# Patient Record
Sex: Female | Born: 1995 | Race: Black or African American | Hispanic: No | Marital: Single | Smoking: Never smoker
Health system: Southern US, Community
[De-identification: ages and names within clinical notes are randomized; demographics above are authoritative.]

## PROBLEM LIST (undated history)

## (undated) DIAGNOSIS — J45909 Unspecified asthma, uncomplicated: Secondary | ICD-10-CM

## (undated) DIAGNOSIS — E282 Polycystic ovarian syndrome: Secondary | ICD-10-CM

## (undated) HISTORY — DX: Polycystic ovarian syndrome: E28.2

---
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
## 2015-06-21 ENCOUNTER — Emergency Department (HOSPITAL_COMMUNITY): Payer: Managed Care, Other (non HMO)

## 2015-06-21 ENCOUNTER — Encounter (HOSPITAL_COMMUNITY): Payer: Self-pay | Admitting: Family Medicine

## 2015-06-21 ENCOUNTER — Emergency Department (HOSPITAL_COMMUNITY)
Admission: EM | Admit: 2015-06-21 | Discharge: 2015-06-21 | Disposition: A | Discharge status: Home / Self Care | Payer: Managed Care, Other (non HMO) | Attending: Emergency Medicine | Admitting: Emergency Medicine

## 2015-06-21 DIAGNOSIS — R197 Diarrhea, unspecified: Secondary | ICD-10-CM | POA: Insufficient documentation

## 2015-06-21 DIAGNOSIS — Z792 Long term (current) use of antibiotics: Secondary | ICD-10-CM | POA: Insufficient documentation

## 2015-06-21 DIAGNOSIS — Z3202 Encounter for pregnancy test, result negative: Secondary | ICD-10-CM | POA: Diagnosis not present

## 2015-06-21 DIAGNOSIS — Z7952 Long term (current) use of systemic steroids: Secondary | ICD-10-CM | POA: Diagnosis not present

## 2015-06-21 DIAGNOSIS — Z79899 Other long term (current) drug therapy: Secondary | ICD-10-CM | POA: Insufficient documentation

## 2015-06-21 DIAGNOSIS — R1030 Lower abdominal pain, unspecified: Secondary | ICD-10-CM

## 2015-06-21 DIAGNOSIS — K5900 Constipation, unspecified: Secondary | ICD-10-CM | POA: Insufficient documentation

## 2015-06-21 DIAGNOSIS — R1031 Right lower quadrant pain: Secondary | ICD-10-CM | POA: Insufficient documentation

## 2015-06-21 DIAGNOSIS — J45909 Unspecified asthma, uncomplicated: Secondary | ICD-10-CM | POA: Diagnosis not present

## 2015-06-21 DIAGNOSIS — R109 Unspecified abdominal pain: Secondary | ICD-10-CM | POA: Diagnosis present

## 2015-06-21 DIAGNOSIS — R102 Pelvic and perineal pain: Secondary | ICD-10-CM

## 2015-06-21 LAB — COMPREHENSIVE METABOLIC PANEL
ALBUMIN: 3.7 g/dL (ref 3.5–5.0)
ALT: 18 U/L (ref 14–54)
ANION GAP: 8 (ref 5–15)
AST: 25 U/L (ref 15–41)
Alkaline Phosphatase: 67 U/L (ref 38–126)
BILIRUBIN TOTAL: 0.3 mg/dL (ref 0.3–1.2)
BUN: 7 mg/dL (ref 6–20)
CHLORIDE: 103 mmol/L (ref 101–111)
CO2: 26 mmol/L (ref 22–32)
Calcium: 9.1 mg/dL (ref 8.9–10.3)
Creatinine, Ser: 1.03 mg/dL — ABNORMAL HIGH (ref 0.44–1.00)
GFR calc Af Amer: >60 mL/min (ref >60)
GFR calc non Af Amer: >60 mL/min (ref >60)
GLUCOSE: 111 mg/dL — AB (ref 65–99)
Potassium: 3.8 mmol/L (ref 3.5–5.1)
Sodium: 137 mmol/L (ref 135–145)
Total Protein: 6.9 g/dL (ref 6.5–8.1)

## 2015-06-21 LAB — CBC
HEMATOCRIT: 37.1 % (ref 36.0–46.0)
HEMOGLOBIN: 11.9 g/dL — AB (ref 12.0–15.0)
MCH: 25.4 pg — ABNORMAL LOW (ref 26.0–34.0)
MCHC: 32.1 g/dL (ref 30.0–36.0)
MCV: 79.1 fL (ref 78.0–100.0)
Platelets: 349 10*3/uL (ref 150–400)
RBC: 4.69 MIL/uL (ref 3.87–5.11)
RDW: 15 % (ref 11.5–15.5)
WBC: 6.2 10*3/uL (ref 4.0–10.5)

## 2015-06-21 LAB — URINALYSIS, ROUTINE W REFLEX MICROSCOPIC
Bilirubin Urine: NEGATIVE
Glucose, UA: NEGATIVE mg/dL
Hgb urine dipstick: NEGATIVE
Ketones, ur: NEGATIVE mg/dL
Leukocytes, UA: NEGATIVE
Nitrite: NEGATIVE
PROTEIN: NEGATIVE mg/dL
SPECIFIC GRAVITY, URINE: 1.007 (ref 1.005–1.030)
UROBILINOGEN UA: 0.2 mg/dL (ref 0.0–1.0)
pH: 7 (ref 5.0–8.0)

## 2015-06-21 LAB — I-STAT BETA HCG BLOOD, ED (MC, WL, AP ONLY)

## 2015-06-21 LAB — LIPASE, BLOOD: Lipase: 32 U/L (ref 11–51)

## 2015-06-21 MED ORDER — DICYCLOMINE HCL 20 MG PO TABS: 20.0000 mg | ORAL_TABLET | Freq: Two times a day (BID) | ORAL | Status: AC

## 2015-06-21 MED ORDER — SODIUM CHLORIDE 0.9 % IV BOLUS (SEPSIS)
1000.0000 mL | Freq: Once | INTRAVENOUS | Status: AC
  Administered 2015-06-21: 1000 mL via INTRAVENOUS

## 2015-06-21 MED ORDER — KETOROLAC TROMETHAMINE 30 MG/ML IJ SOLN
15.0000 mg | Freq: Once | INTRAMUSCULAR | Status: AC
  Administered 2015-06-21: 15 mg via INTRAVENOUS
  Filled 2015-06-21: qty 1

## 2015-06-21 NOTE — Discharge Instructions (Signed)
As discussed, with your ongoing abdominal pain, diarrhea, there suspicion for colitis or irritation of your intestinal tract.  If you develop new, or concerning changes, such as those we discussed, it is very important that you return here for further evaluation and management.  Return here for any concerning changes in your condition.  Otherwise, please be sure to follow-up with your primary care physician within one week to ensure appropriate ongoing care.

## 2015-06-21 NOTE — ED Notes (Signed)
Patient transported to Ultrasound 

## 2015-06-21 NOTE — ED Notes (Signed)
Pt here for intermittent abd pain since Saturday. sts battling some constipation. sts she has been eating and drinking normally but some nausea.

## 2015-06-21 NOTE — ED Provider Notes (Signed)
CSN: [id number]     Arrival date & time 06/21/15  1326 History   First MD Initiated Contact with Patient 06/21/15 1505     Chief Complaint  Patient presents with  . Abdominal Pain  . Constipation     (Consider location/radiation/quality/duration/timing/severity/associated sxs/prior Treatment) HPI Patient presents with new suprapubic and right lower quadrant pain. Pain began about 5 days ago, since onset it has been intermittent, but with increasing frequency, length of symptoms. Pain is severe, crampy, sharp. There is associated anorexia, nausea, diarrhea, as well as tenesmus. No urinary changes, no vomiting, no fever, chills.  No vaginal complaints, last menstrual period was one month ago Patient denies medical palms beyond PCOS. She takes no medication regularly, does not smoke.  She denies a history of abdominal surgery   Past Medical History  Diagnosis Date  . Asthma    History reviewed. No pertinent past surgical history. History reviewed. No pertinent family history. Social History  Substance Use Topics  . Smoking status: Never Smoker   . Smokeless tobacco: Never Used  . Alcohol Use: Yes     Comment: socially   OB History    No data available     Review of Systems  Constitutional:       Per HPI, otherwise negative  HENT:       Per HPI, otherwise negative  Respiratory:       Per HPI, otherwise negative  Cardiovascular:       Per HPI, otherwise negative  Gastrointestinal: Positive for abdominal pain and diarrhea. Negative for vomiting.  Endocrine:       Negative aside from HPI  Genitourinary:       Neg aside from HPI   Musculoskeletal:       Per HPI, otherwise negative  Skin: Negative.   Neurological: Negative for syncope.      Allergies  Review of patient's allergies indicates no known allergies.  Home Medications   Prior to Admission medications   Medication Sig Start Date End Date Taking? Authorizing Provider  Multiple Vitamin (MULTIVITAMIN)  tablet Take 1 tablet by mouth daily.   Yes Historical Provider, MD  triamcinolone cream (KENALOG) 0.1 % Apply 1 application topically 2 (two) times daily. 05/02/15  Yes Historical Provider, MD  cephALEXin (KEFLEX) 500 MG capsule Take 1 capsule (500 mg total) by mouth 4 (four) times daily. Patient not taking: Reported on 06/21/2015 09/30/14   Hayden Rasmussenavid Mabe, NP  dicyclomine (BENTYL) 20 MG tablet Take 1 tablet (20 mg total) by mouth 2 (two) times daily. 06/21/15   Gerhard Munchobert Ziv Welchel, MD  fluconazole (DIFLUCAN) 150 MG tablet 1 tab po x 1. May repeat in 72 hours if no improvement Patient not taking: Reported on 06/21/2015 09/30/14   Hayden Rasmussenavid Mabe, NP   BP 121/60 mmHg  Pulse 80  Temp(Src) 98.8 F (37.1 C)  Resp 18  Wt 170 lb (77.111 kg)  SpO2 100% Physical Exam  Constitutional: She is oriented to person, place, and time. She appears well-developed and well-nourished. No distress.  HENT:  Head: Normocephalic and atraumatic.  Eyes: Conjunctivae and EOM are normal.  Cardiovascular: Normal rate and regular rhythm.   Pulmonary/Chest: Effort normal and breath sounds normal. No stridor. No respiratory distress.  Abdominal: She exhibits no distension. There is tenderness in the right lower quadrant and suprapubic area. There is guarding. There is no rigidity and no rebound.  Musculoskeletal: She exhibits no edema.  Neurological: She is alert and oriented to person, place, and time. No cranial nerve  deficit.  Skin: Skin is warm and dry.  Psychiatric: She has a normal mood and affect.  Nursing note and vitals reviewed.   ED Course  Procedures (including critical care time) Labs Review Labs Reviewed  COMPREHENSIVE METABOLIC PANEL - Abnormal; Notable for the following:    Glucose, Bld 111 (*)    Creatinine, Ser 1.03 (*)    All other components within normal limits  CBC - Abnormal; Notable for the following:    Hemoglobin 11.9 (*)    MCH 25.4 (*)    All other components within normal limits  LIPASE, BLOOD   URINALYSIS, ROUTINE W REFLEX MICROSCOPIC (NOT AT [facility])  I-STAT BETA HCG BLOOD, ED ([facility], [facility], [facility] ONLY)    Imaging Review US Transvaginal Non-ob  06/21/2015  CLINICAL DATA:  19 year old female with reported history of polycystic ovarian syndrome presents with mid to right-sided abdominopelvic pain for 5 days. Negative serum pregnancy test. LMP 05/27/2015, day 26 of menstrual cycle. EXAM: TRANSABDOMINAL AND TRANSVAGINAL ULTRASOUND OF PELVIS TECHNIQUE: Both transabdominal and transvaginal ultrasound examinations of the pelvis were performed. Transabdominal technique was performed for global imaging of the pelvis including uterus, ovaries, adnexal regions, and pelvic cul-de-sac. It was necessary to proceed with endovaginal exam following the transabdominal exam to visualize the endometrium and adnexa. COMPARISON:  None FINDINGS: Uterus Measurements: 6.2 x 3.1 x 3.7 cm. The anteverted anteflexed uterus is normal in size and configuration, with no uterine fibroids or other myometrial abnormality. Endometrium Thickness: 10 mm.  No focal abnormality visualized. Right ovary Measurements: 2.5 x 2.1 x 1.9 cm, for a right ovarian volume of 5 cc. Normal appearance/no adnexal mass. No evidence of increased number of immature peripheral follicles. Left ovary Measurements: 2.6 x 2.2 x 2.0 cm, for a left ovarian volume of 6 cc. Normal appearance/no adnexal mass. No evidence of increased number of immature peripheral follicles. Other findings No abnormal free fluid in the pelvis. IMPRESSION: Normal pelvic sonogram. Ovaries appear normal and do not meet sonographic criteria for polycystic ovarian syndrome. Electronically Signed   By: Delbert Phenix M.D.   On: 06/21/2015 16:45   US Pelvis Complete  06/21/2015  CLINICAL DATA:  19 year old female with reported history of polycystic ovarian syndrome presents with mid to right-sided abdominopelvic pain for 5 days. Negative serum pregnancy test. LMP 05/27/2015, day 26 of menstrual  cycle. EXAM: TRANSABDOMINAL AND TRANSVAGINAL ULTRASOUND OF PELVIS TECHNIQUE: Both transabdominal and transvaginal ultrasound examinations of the pelvis were performed. Transabdominal technique was performed for global imaging of the pelvis including uterus, ovaries, adnexal regions, and pelvic cul-de-sac. It was necessary to proceed with endovaginal exam following the transabdominal exam to visualize the endometrium and adnexa. COMPARISON:  None FINDINGS: Uterus Measurements: 6.2 x 3.1 x 3.7 cm. The anteverted anteflexed uterus is normal in size and configuration, with no uterine fibroids or other myometrial abnormality. Endometrium Thickness: 10 mm.  No focal abnormality visualized. Right ovary Measurements: 2.5 x 2.1 x 1.9 cm, for a right ovarian volume of 5 cc. Normal appearance/no adnexal mass. No evidence of increased number of immature peripheral follicles. Left ovary Measurements: 2.6 x 2.2 x 2.0 cm, for a left ovarian volume of 6 cc. Normal appearance/no adnexal mass. No evidence of increased number of immature peripheral follicles. Other findings No abnormal free fluid in the pelvis. IMPRESSION: Normal pelvic sonogram. Ovaries appear normal and do not meet sonographic criteria for polycystic ovarian syndrome. Electronically Signed   By: Delbert Phenix M.D.   On: 06/21/2015 16:45   I have  personally reviewed and evaluated these images and lab results as part of my medical decision-making.   On repeat exam after the patient's ultrasound result, the patient states that she feels better. She and I had a lengthy conversation about her abdominal pain, the remaining low suspicion for appendicitis, but the remaining possibility. We discussed risks and benefits of CT scan, given her lack of fever, leukocytosis, persistent pain, vomiting, and she elected for close outpatient follow-up, to minimize radiation exposure. This seems reasonable.   MDM   Final diagnoses:  Lower abdominal pain   Generally  well-appearing.  Presents with intermittent abdominal pain for several days, no vaginal, urinary complaints. Patient does have history of PCO S, and with her lower pain, ultrasound was performed. Does not demonstrate torsion, nor substantial pathology. Patient's labs, vitals, physical presentation reassuring, and there is low suspicion for appendicitis, though patient was discharged in stable condition with explicit return precautions, as well as medication for pain relief, primary care follow-up.  Gerhard Munch, MD 06/21/15 1755

## 2015-08-09 ENCOUNTER — Emergency Department: Payer: Commercial Managed Care - PPO

## 2015-08-09 ENCOUNTER — Emergency Department
Admission: EM | Admit: 2015-08-09 | Discharge: 2015-08-09 | Disposition: A | Discharge status: Home / Self Care | Payer: Commercial Managed Care - PPO | Attending: Emergency Medicine | Admitting: Emergency Medicine

## 2015-08-09 DIAGNOSIS — L03115 Cellulitis of right lower limb: Secondary | ICD-10-CM | POA: Insufficient documentation

## 2015-08-09 MED ORDER — CLINDAMYCIN HCL 300 MG PO CAPS
300.0000 mg | ORAL_CAPSULE | Freq: Four times a day (QID) | ORAL | Status: AC
Start: 2015-08-09 — End: 2015-08-16

## 2015-08-09 NOTE — ED Notes (Signed)
Rash on feet and legs since this am. Believes she was bitten by something on lt toe.

## 2015-08-09 NOTE — ED Provider Notes (Signed)
EMERGENCY DEPARTMENT HISTORY AND PHYSICAL EXAM     Physician/Midlevel provider first contact with patient: 08/09/15 1538         Date: 08/09/2015  Patient Name: Melanie Mcclain    History of Presenting Illness     Chief Complaint   Patient presents with   . Rash       History Provided By: Patient    Chief Complaint: rash to right leg   Onset: this morning  Timing: Suddenly  Location: right calf  Quality: Achy / itchy  Severity: Moderate  Modifying Factors: better with benadryl  Associated Symptoms: redness and warmth to area    Additional History: Melanie Mcclain is a 19 y.o. female presenting to the ED with red itchy raised swollen patch to right calf since this morning, states feels like she was bit by something, denies fevers or chills, states had something similar a few years ago and was treated with steroids. Too hydroxyzine this morning for itching     PCP: No primary care provider on file.      No current facility-administered medications for this encounter.     Current Outpatient Prescriptions   Medication Sig Dispense Refill   . clindamycin (CLEOCIN) 300 MG capsule Take 1 capsule (300 mg total) by mouth 4 (four) times daily. 28 capsule 0       Past History     Past Medical History:  Past Medical History   Diagnosis Date   . PCOS (polycystic ovarian syndrome)        Past Surgical History:  Past Surgical History   Procedure Laterality Date   . No past surgeries         Family History:  No family history on file.    Social History:  Social History   Substance Use Topics   . Smoking status: Never Smoker    . Smokeless tobacco: None   . Alcohol Use: No       Allergies:  No Known Allergies    Review of Systems     Review of Systems   Constitutional: Negative for fever, chills and malaise/fatigue.   HENT: Negative for congestion, ear pain and sore throat.    Eyes: Negative for blurred vision, double vision, photophobia and discharge.   Respiratory: Negative for cough, sputum production, shortness of breath and  wheezing.    Cardiovascular: Negative for chest pain, palpitations, claudication and leg swelling.   Gastrointestinal: Negative for nausea, vomiting, abdominal pain and diarrhea.   Genitourinary: Negative for dysuria, urgency, frequency, hematuria and flank pain.   Musculoskeletal: Negative for myalgias, back pain, joint pain and neck pain.   Skin: Positive for itching and rash.   Neurological: Negative for dizziness, tingling, tremors, focal weakness, seizures, loss of consciousness, weakness and headaches.       Physical Exam   BP 140/84 mmHg  Pulse 87  Temp(Src) 97.8 F (36.6 C) (Oral)  Resp 14  Ht 4\' 11"  (1.499 m)  Wt 68.04 kg  BMI 30.28 kg/m2  SpO2 99%  LMP 05/26/2015    Physical Exam   Constitutional: She is oriented to person, place, and time and well-developed, well-nourished, and in no distress. No distress.   Eyes: Conjunctivae are normal. Pupils are equal, round, and reactive to light. Right eye exhibits no discharge. Left eye exhibits no discharge.   Cardiovascular: Normal rate, regular rhythm, normal heart sounds and intact distal pulses.    Pulmonary/Chest: Effort normal and breath sounds normal. No respiratory distress. She  exhibits no tenderness.   Musculoskeletal:        Right knee: Normal.        Legs:       Feet:    Neurological: She is alert and oriented to person, place, and time. No cranial nerve deficit. Coordination normal. GCS score is 15.   Skin: Skin is warm and dry. She is not diaphoretic. There is erythema.   Psychiatric: Mood, memory, affect and judgment normal.       Diagnostic Study Results     Labs -     Results     ** No results found for the last 24 hours. **          Radiologic Studies -   Radiology Results (24 Hour)     ** No results found for the last 24 hours. **      .    Medical Decision Making   I am the first provider for this patient.    I reviewed the vital signs, available nursing notes, past medical history, past surgical history, family history and social  history.    Vital Signs-Reviewed the patient's vital signs.     Patient Vitals for the past 12 hrs:   BP Temp Pulse Resp   08/09/15 1446 140/84 mmHg 97.8 F (36.6 C) 87 14       Pulse Oximetry Analysis - Normal 99% on ra    Procedures:    Core Measures:    Critical Care Time:     Old Medical Records:      ED Course:     Provider Notes: rash, likely allergic vs infectious, agrees to treat for both, non-toxic appearing, to d/c home with follow up at clinic or with IM. Agrees with treatment and plan.     Diagnosis     Clinical Impression:   1. Cellulitis of right lower extremity        Treatment Plan:   ED Disposition     Discharge Melanie Mcclain discharge to home/self care.    Condition at disposition: Stable                    Heriberto Ant[place], [place]  08/09/15 1624    French Ana, MD  08/09/15 2126

## 2015-08-09 NOTE — Discharge Instructions (Signed)
Cellulitis    You were diagnosed with cellulitis.    This is a bacterial infection of the skin. Symptoms are usually redness, swelling, and warmth in the affected area. Some people get a fever (temperature higher than 100.4F / 38C) with this infection.    Keep the extremity (arm or leg) above your heart level if possible.    Cellulitis is treated with antibiotics. It is also treated by keeping the affected area elevated (up). Sometimes, antibiotics are given intravenously ("IV"). Other infections can be treated with oral (by mouth) medicines.    Redness, swelling, warmth, and fever should start to get better after 2-3 days of treatment. Come back here or go to the nearest Emergency Department or your primary doctor for a re-check as directed.    Return here or go to the nearest Emergency Department in 48 hours. This will be for another examination.    YOU SHOULD SEEK MEDICAL ATTENTION IMMEDIATELY, EITHER HERE OR AT THE NEAREST EMERGENCY DEPARTMENT, IF ANY OF THE FOLLOWING OCCURS:   Redness spreads even with treatment. You can mark the infection area with a pen. This will help watch for improvement or spreading.   Fever (temperature higher than 100.4F / 38C) doesn t go away or gets worse after 2-3 days of antibiotics.   Unusual or increasing pain in the infected area.   Lightheadedness.   Feeling sicker at any time or not getting better as expected.

## 2015-11-16 ENCOUNTER — Encounter (HOSPITAL_COMMUNITY): Payer: Self-pay

## 2015-11-16 ENCOUNTER — Emergency Department (HOSPITAL_COMMUNITY)
Admission: EM | Admit: 2015-11-16 | Discharge: 2015-11-16 | Disposition: A | Discharge status: Home / Self Care | Payer: Managed Care, Other (non HMO) | Attending: Emergency Medicine | Admitting: Emergency Medicine

## 2015-11-16 DIAGNOSIS — Z79899 Other long term (current) drug therapy: Secondary | ICD-10-CM | POA: Insufficient documentation

## 2015-11-16 DIAGNOSIS — J029 Acute pharyngitis, unspecified: Secondary | ICD-10-CM | POA: Insufficient documentation

## 2015-11-16 DIAGNOSIS — J45909 Unspecified asthma, uncomplicated: Secondary | ICD-10-CM | POA: Diagnosis not present

## 2015-11-16 DIAGNOSIS — Z7952 Long term (current) use of systemic steroids: Secondary | ICD-10-CM | POA: Diagnosis not present

## 2015-11-16 LAB — RAPID STREP SCREEN (MED CTR MEBANE ONLY): STREPTOCOCCUS, GROUP A SCREEN (DIRECT): NEGATIVE

## 2015-11-16 NOTE — ED Notes (Signed)
Patient here with sore throat x 3 days, no other associated symptoms 

## 2015-11-16 NOTE — ED Provider Notes (Signed)
History  By signing my name below, I, Karle Plumber, attest that this documentation has been prepared under the direction and in the presence of Rob Richton, [place]. Electronically Signed: Karle Plumber, ED Scribe. 11/16/2015. 12:06 PM.  Chief Complaint  Patient presents with  . Sore Throat   The history is provided by the patient and medical records. No language interpreter was used.    HPI Comments:  Amy Vaughn is a 20 y.o. obese female who presents to the Emergency Department complaining of sore throat that began four days ago. She denies any other associated symptoms. She has been drinking hot tea and has taken Ibuprofen for the pain with some relief. Eating increases her pain. She denies alleviating factors. She denies cough, fever, chills, difficulty swallowing, nausea or vomiting.   Past Medical History  Diagnosis Date  . Asthma    History reviewed. No pertinent past surgical history. No family history on file. Social History  Substance Use Topics  . Smoking status: Never Smoker   . Smokeless tobacco: Never Used  . Alcohol Use: Yes     Comment: socially   OB History    No data available     Review of Systems  Constitutional: Negative for fever and chills.  HENT: Positive for sore throat. Negative for trouble swallowing.   Respiratory: Negative for cough.   Gastrointestinal: Negative for nausea and vomiting.    Allergies  Review of patient's allergies indicates no known allergies.  Home Medications   Prior to Admission medications   Medication Sig Start Date End Date Taking? Authorizing Provider  cephALEXin (KEFLEX) 500 MG capsule Take 1 capsule (500 mg total) by mouth 4 (four) times daily. Patient not taking: Reported on 06/21/2015 09/30/14   Hayden Rasmussen, NP  dicyclomine (BENTYL) 20 MG tablet Take 1 tablet (20 mg total) by mouth 2 (two) times daily. 06/21/15   Gerhard Munch, MD  fluconazole (DIFLUCAN) 150 MG tablet 1 tab po x 1. May repeat in 72 hours  if no improvement Patient not taking: Reported on 06/21/2015 09/30/14   Hayden Rasmussen, NP  Multiple Vitamin (MULTIVITAMIN) tablet Take 1 tablet by mouth daily.    Historical Provider, MD  triamcinolone cream (KENALOG) 0.1 % Apply 1 application topically 2 (two) times daily. 05/02/15   Historical Provider, MD   Triage Vitals: BP 128/84 mmHg  Pulse 97  Temp(Src) 99.1 F (37.3 C) (Oral)  Resp 16  Ht  (1.499 m)  Wt 184 lb (83.462 kg)  BMI 37.14 kg/m2  SpO2 98% Physical Exam Physical Exam  Constitutional: Pt appears well-developed and well-nourished. No distress.  Awake, alert, nontoxic appearance  HENT:  Head: Normocephalic and atraumatic.  Mouth/Throat: Oropharynx is mildly erythematous and moist. No oropharyngeal exudate or tonsillar abscess.  Eyes: Conjunctivae are normal. No scleral icterus.  Neck: Normal range of motion. Neck supple.  Cardiovascular: Normal rate, regular rhythm and intact distal pulses.   Pulmonary/Chest: Effort normal and breath sounds normal. No respiratory distress. Pt has no wheezes.  Equal chest expansion  Abdominal: Soft. Bowel sounds are normal. Pt exhibits no mass. There is no tenderness. There is no rebound and no guarding.  Musculoskeletal: Normal range of motion. Pt exhibits no edema.  Neurological: Pt is alert.  Speech is clear and goal oriented Moves extremities without ataxia  Skin: Skin is warm and dry. Pt is not diaphoretic.  Psychiatric: Pt has a normal mood and affect.  Nursing note and vitals reviewed.   ED Course  Procedures (including critical  care time) DIAGNOSTIC STUDIES: Oxygen Saturation is 98% on RA, normal by my interpretation.   COORDINATION OF CARE: 12:04 PM- Advised pt that strep test was negative and a culture would be done. Encouraged pt to continue with hot tea Ibuprofen for pain. Return precautions discussed. Pt verbalizes understanding and agrees to plan.  Results for orders placed or performed during the hospital  encounter of 11/16/15  Rapid strep screen  Result Value Ref Range   Streptococcus, Group A Screen (Direct) NEGATIVE NEGATIVE   No results found.   I have personally reviewed and evaluated these images and lab results as part of my medical decision-making.    MDM   Final diagnoses:  Pharyngitis    Pt afebrile without tonsillar exudate, negative strep. Presents with mild cervical lymphadenopathy, & dysphagia; diagnosis of viral pharyngitis. No abx indicated. DC w symptomatic tx for pain  Pt does not appear dehydrated, but did discuss importance of water rehydration. Presentation non concerning for PTA or infxn spread to soft tissue. No trismus or uvula deviation. Specific return precautions discussed. Pt able to drink water in ED without difficulty with intact air way. Recommended PCP follow up.  I personally performed the services described in this documentation, which was scribed in my presence. The recorded information has been reviewed and is accurate.       Roxy Horsemanobert Cloe Sockwell, PA-C 11/16/15 [id number]  Mancel BaleElliott Wentz, MD 11/16/15 [phone]

## 2015-11-16 NOTE — Discharge Instructions (Signed)

## 2015-11-18 LAB — CULTURE, GROUP A STREP (THRC)

## 2016-09-15 IMAGING — US US TRANSVAGINAL NON-OB
1 series · 13 of 25 positions shown · non-contrast
Comparison: None

CLINICAL DATA: 19-year-old female with reported history of
polycystic ovarian syndrome presents with mid to right-sided
abdominopelvic pain for 5 days. Negative serum pregnancy test. LMP
05/27/2015, day 26 of menstrual cycle.

EXAM:
TRANSABDOMINAL AND TRANSVAGINAL ULTRASOUND OF PELVIS
TECHNIQUE: Both transabdominal and transvaginal ultrasound examinations of the
pelvis were performed. Transabdominal technique was performed for
global imaging of the pelvis including uterus, ovaries, adnexal
regions, and pelvic cul-de-sac. It was necessary to proceed with
endovaginal exam following the transabdominal exam to visualize the
endometrium and adnexa.

[Series 1: us transvaginal non-ob · 0.24mm/px · 13 of 80 slices shown]
[im 1/80]
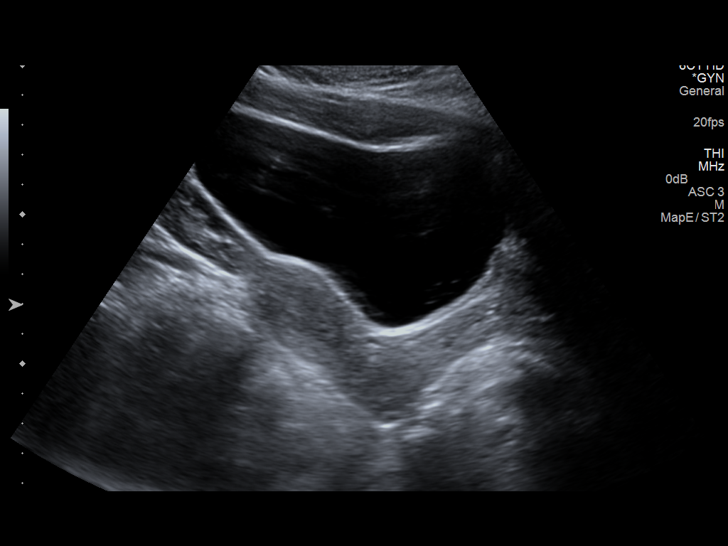
[im 7/80]
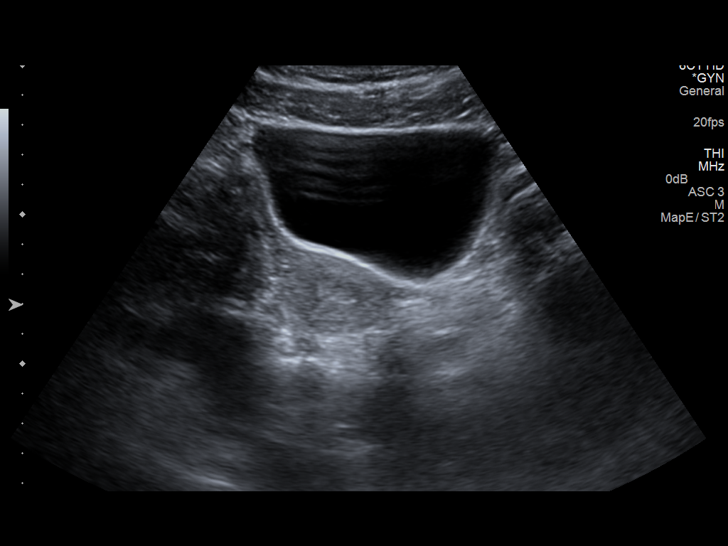
[im 14/80]
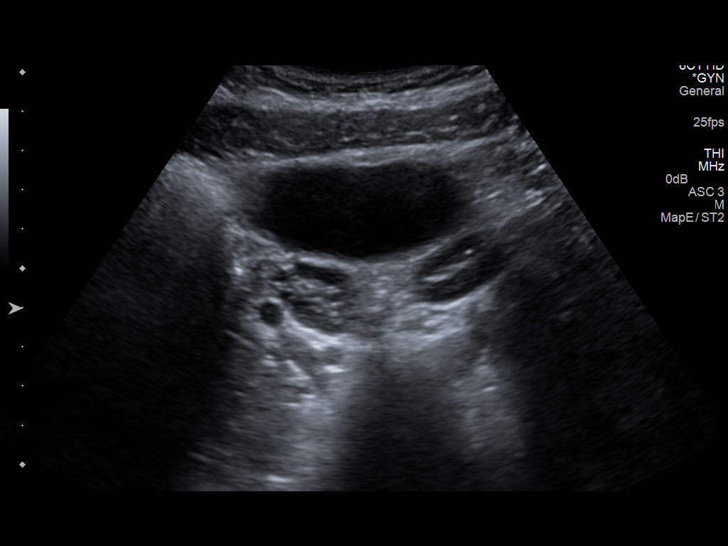
[im 20/80]
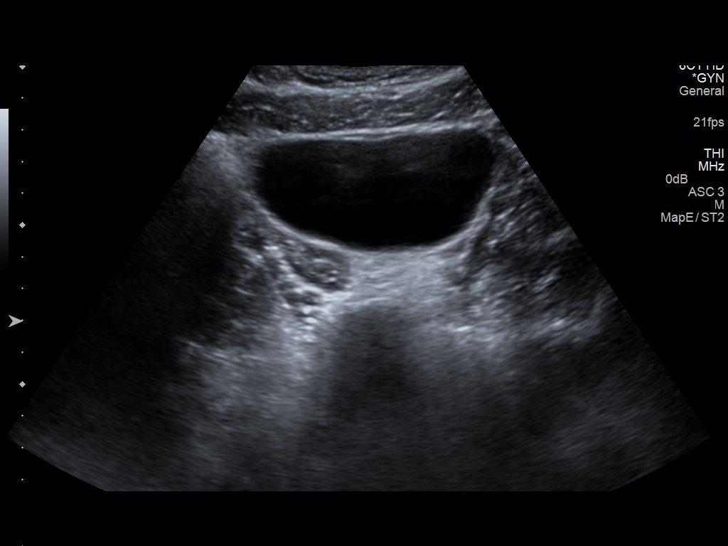
[im 27/80]
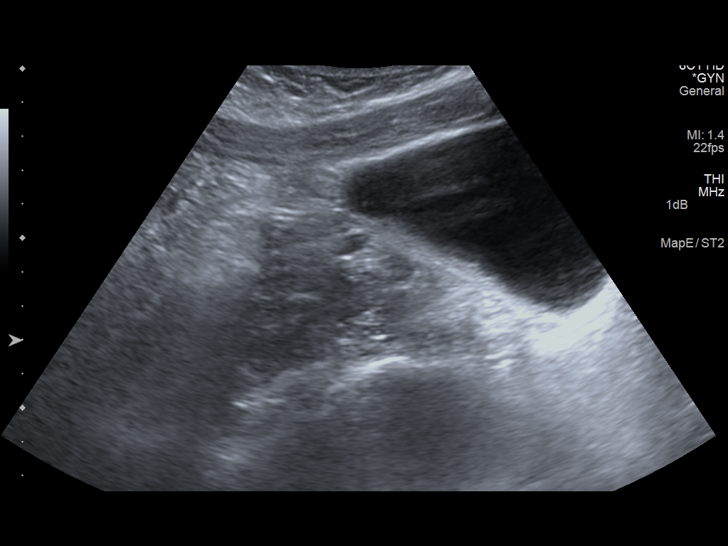
[im 33/80]
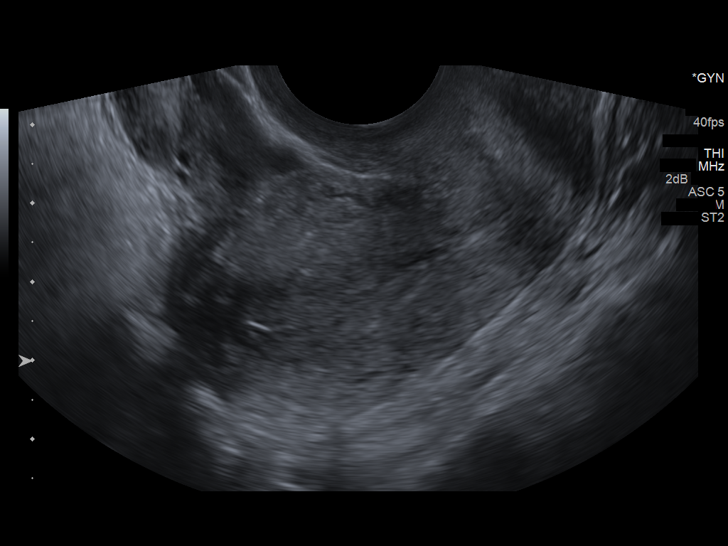
[im 40/80]
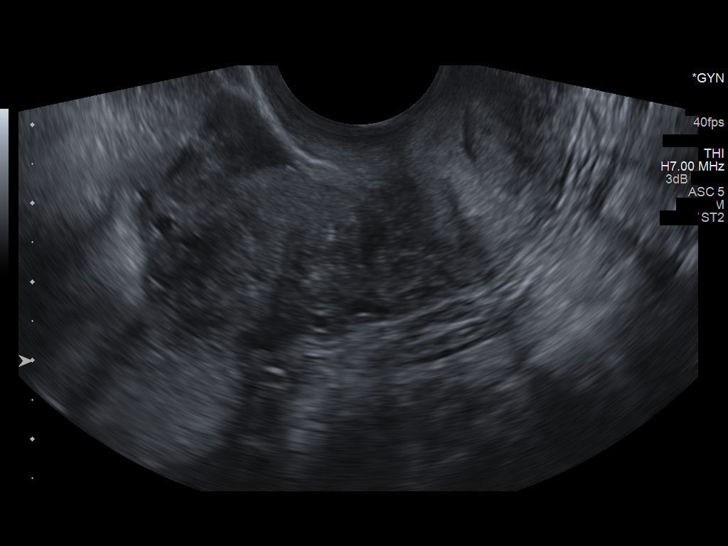
[im 47/80]
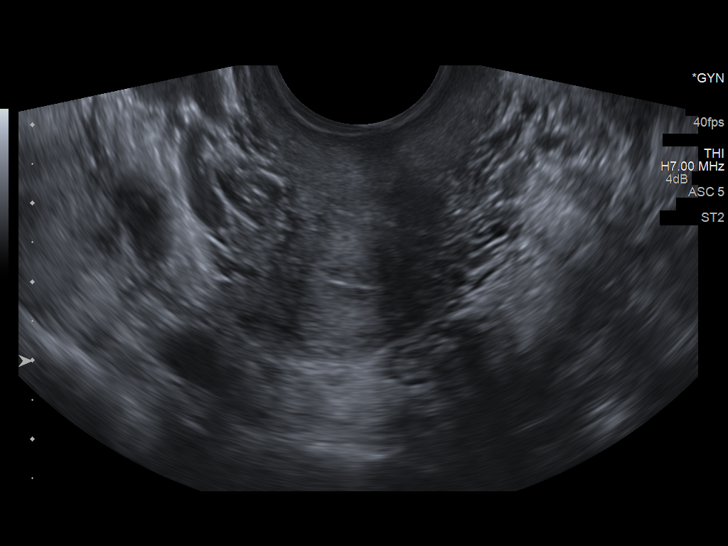
[im 53/80]
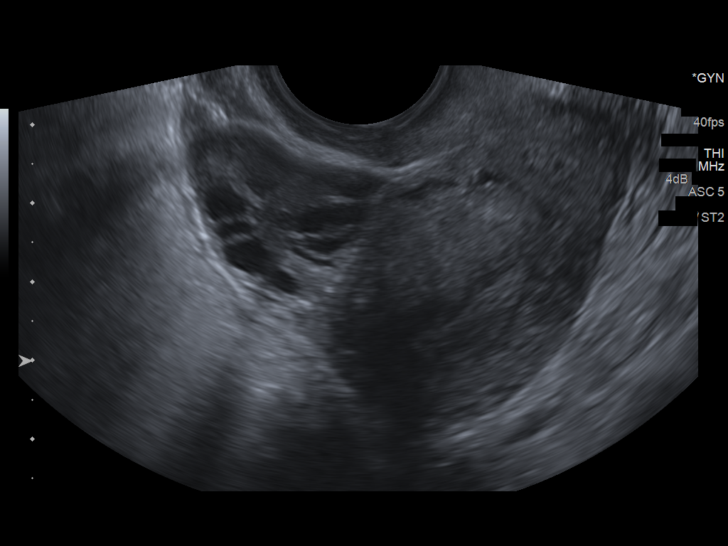
[im 60/80]
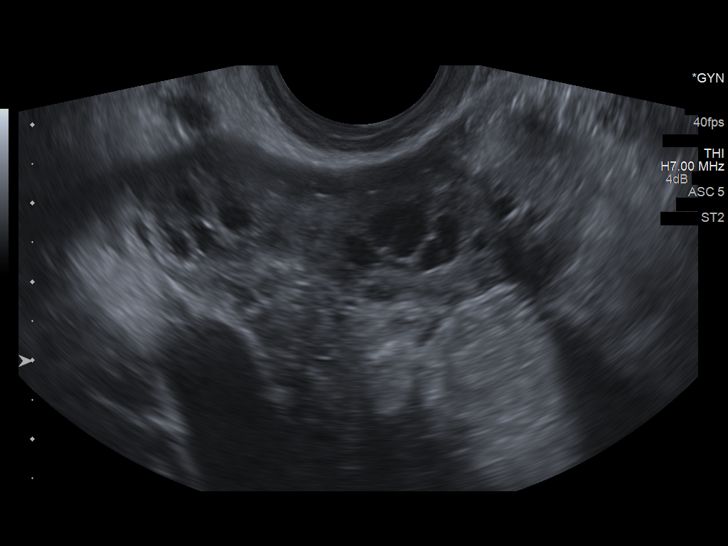
[im 66/80]
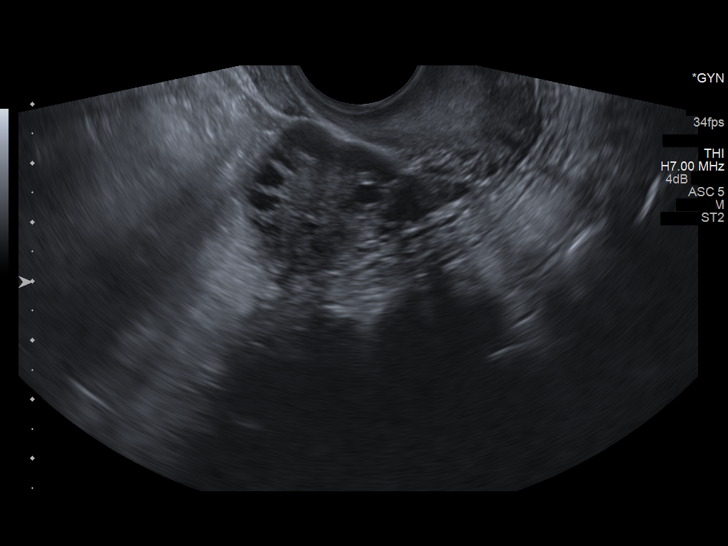
[im 73/80]
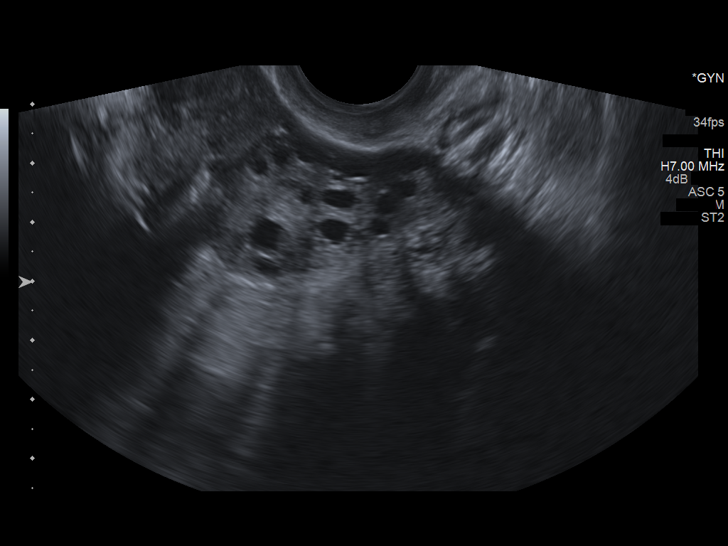
[im 80/80]
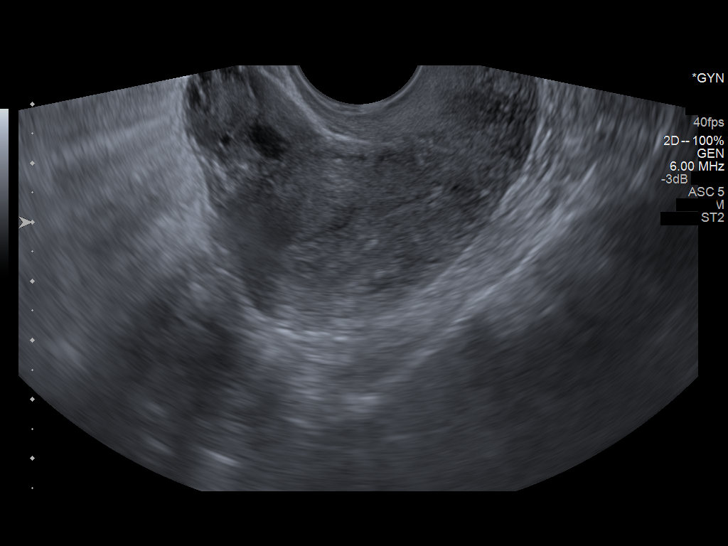

[13 of 25 positions shown; findings below may reference images not displayed]

FINDINGS: Uterus

Measurements: 6.2 x 3.1 x 3.7 cm. The anteverted anteflexed uterus
is normal in size and configuration, with no uterine fibroids or
other myometrial abnormality.

Endometrium

Thickness: 10 mm.  No focal abnormality visualized.

Right ovary

Measurements: 2.5 x 2.1 x 1.9 cm, for a right ovarian volume of 5
cc. Normal appearance/no adnexal mass. No evidence of increased
number of immature peripheral follicles.

Left ovary

Measurements: 2.6 x 2.2 x 2.0 cm, for a left ovarian volume of 6 cc.
Normal appearance/no adnexal mass. No evidence of increased number
of immature peripheral follicles.

Other findings

No abnormal free fluid in the pelvis.
IMPRESSION: Normal pelvic sonogram. Ovaries appear normal and do not meet
sonographic criteria for polycystic ovarian syndrome.
